# Patient Record
Sex: Male | Born: 1960 | Race: Asian | Hispanic: No | Marital: Single | Smoking: Former smoker
Health system: Southern US, Community
[De-identification: ages and names within clinical notes are randomized; demographics above are authoritative.]

---

## 2013-09-20 ENCOUNTER — Observation Stay (HOSPITAL_COMMUNITY)
Admission: EM | Admit: 2013-09-20 | Discharge: 2013-09-21 | Disposition: A | Discharge status: Home / Self Care | Payer: Self-pay | Attending: Internal Medicine | Admitting: Internal Medicine

## 2013-09-20 ENCOUNTER — Emergency Department (HOSPITAL_COMMUNITY): Payer: Self-pay

## 2013-09-20 ENCOUNTER — Encounter (HOSPITAL_COMMUNITY): Payer: Self-pay | Admitting: Emergency Medicine

## 2013-09-20 DIAGNOSIS — E872 Acidosis, unspecified: Secondary | ICD-10-CM | POA: Diagnosis present

## 2013-09-20 DIAGNOSIS — T7840XA Allergy, unspecified, initial encounter: Secondary | ICD-10-CM

## 2013-09-20 DIAGNOSIS — R220 Localized swelling, mass and lump, head: Secondary | ICD-10-CM | POA: Diagnosis present

## 2013-09-20 DIAGNOSIS — Z23 Encounter for immunization: Secondary | ICD-10-CM | POA: Insufficient documentation

## 2013-09-20 DIAGNOSIS — R61 Generalized hyperhidrosis: Secondary | ICD-10-CM | POA: Diagnosis present

## 2013-09-20 DIAGNOSIS — K117 Disturbances of salivary secretion: Secondary | ICD-10-CM | POA: Diagnosis present

## 2013-09-20 DIAGNOSIS — R4182 Altered mental status, unspecified: Secondary | ICD-10-CM | POA: Diagnosis present

## 2013-09-20 DIAGNOSIS — I959 Hypotension, unspecified: Principal | ICD-10-CM | POA: Diagnosis present

## 2013-09-20 DIAGNOSIS — R350 Frequency of micturition: Secondary | ICD-10-CM | POA: Diagnosis present

## 2013-09-20 LAB — CG4 I-STAT (LACTIC ACID)
Lactic Acid, Venous: 6.73 mmol/L — ABNORMAL HIGH (ref 0.5–2.2)
Lactic Acid, Venous: 3.01 mmol/L — ABNORMAL HIGH (ref 0.5–2.2)

## 2013-09-20 LAB — URINALYSIS, ROUTINE W REFLEX MICROSCOPIC
Bilirubin Urine: NEGATIVE
Glucose, UA: NEGATIVE mg/dL
Leukocytes, UA: NEGATIVE
Protein, ur: NEGATIVE mg/dL
Specific Gravity, Urine: 1.01 (ref 1.005–1.030)
Urobilinogen, UA: 0.2 mg/dL (ref 0.0–1.0)

## 2013-09-20 LAB — CBC WITH DIFFERENTIAL/PLATELET
Basophils Absolute: 0 10*3/uL (ref 0.0–0.1)
Basophils Relative: 0 % (ref 0–1)
Eosinophils Absolute: 0.1 10*3/uL (ref 0.0–0.7)
Eosinophils Relative: 2 % (ref 0–5)
HCT: 47.2 % (ref 39.0–52.0)
Hemoglobin: 15.8 g/dL (ref 13.0–17.0)
Lymphocytes Relative: 50 % — ABNORMAL HIGH (ref 12–46)
Lymphs Abs: 3.9 10*3/uL (ref 0.7–4.0)
MCH: 26.5 pg (ref 26.0–34.0)
MCHC: 33.5 g/dL (ref 30.0–36.0)
MCV: 79.1 fL (ref 78.0–100.0)
Monocytes Absolute: 0.7 10*3/uL (ref 0.1–1.0)
Monocytes Relative: 8 % (ref 3–12)
Neutro Abs: 3.2 10*3/uL (ref 1.7–7.7)
Neutrophils Relative %: 40 % — ABNORMAL LOW (ref 43–77)
Platelets: 198 10*3/uL (ref 150–400)
RBC: 5.97 MIL/uL — ABNORMAL HIGH (ref 4.22–5.81)
RDW: 13.1 % (ref 11.5–15.5)
WBC: 7.9 10*3/uL (ref 4.0–10.5)

## 2013-09-20 LAB — BASIC METABOLIC PANEL
CO2: 18 mEq/L — ABNORMAL LOW (ref 19–32)
Calcium: 8.1 mg/dL — ABNORMAL LOW (ref 8.4–10.5)
Creatinine, Ser: 1.06 mg/dL (ref 0.50–1.35)
GFR calc non Af Amer: 80 mL/min — ABNORMAL LOW (ref >90)
Glucose, Bld: 131 mg/dL — ABNORMAL HIGH (ref 70–99)
Potassium: 2.9 mEq/L — ABNORMAL LOW (ref 3.5–5.1)
Sodium: 139 mEq/L (ref 135–145)

## 2013-09-20 LAB — URINE MICROSCOPIC-ADD ON

## 2013-09-20 MED ORDER — SODIUM CHLORIDE 0.9 % IV BOLUS (SEPSIS)
1000.0000 mL | Freq: Once | INTRAVENOUS | Status: AC
  Administered 2013-09-20: 1000 mL via INTRAVENOUS

## 2013-09-20 MED ORDER — POTASSIUM CHLORIDE CRYS ER 20 MEQ PO TBCR
40.0000 meq | EXTENDED_RELEASE_TABLET | Freq: Once | ORAL | Status: AC
  Administered 2013-09-20: 40 meq via ORAL
  Filled 2013-09-20: qty 2

## 2013-09-20 NOTE — ED Notes (Signed)
Pt has been having difficulty urinating. Friend gave him a pill, that came out of plastic bag without description. EMS arrived on scene to pt unresponsive, hypotensive, diaphoretic. Arrived to trauma room C Alert, eyes PERRL, shivering, skin moist, obeys commands, speaking broken english. Pt is from [place].

## 2013-09-20 NOTE — ED Notes (Signed)
Translator used. Pt states he has had increased urinary frequency since 1800 hrs. Relative gave him a medication, he became cold and numb around lips. Takes medications for cold and cough. Denies medical hx, and says he has never been tested. Some days only has 1 meal a day. Today more tired than normal.

## 2013-09-20 NOTE — ED Notes (Signed)
[phone] Alecia Lemming is his transport home

## 2013-09-20 NOTE — ED Notes (Signed)
500 ml NS administered by EMS prior to arrival. 2nd bag of 500 ml NS hanging on arrival.

## 2013-09-20 NOTE — ED Provider Notes (Signed)
I saw and evaluated the patient, reviewed the resident's note and I agree with the findings and plan.  EKG Interpretation     Ventricular Rate:  95 PR Interval:  169 QRS Duration: 86 QT Interval:  355 QTC Calculation: 446 R Axis:   75 Text Interpretation:  Sinus rhythm Normal ECG No previous tracing            Pt brought by EMS after syncope.  Limited English, used interpretor, pt is a Engineer, site, originally from [place].  Pt had been complaining of urinary freq, took pills from a friend, appears to be Cipro.  Pt had syncope, unresponsive, EMS reports unresponsive with very low BP, had received 600 ml of IVF's en route, BP was low, became more responsive.  Pt appears dehydrated with dry MM.  abd soft, cardiac is RRR, intact pulses now, seems oriented at this point, no focal deficits.  Lactate was initially elevated, Co2 low, but doubt DKA with glucose only 131, but anion gap of 18; no fever.  After IVF's, lactic acid is improving, K+ is low.  Due to lab abn's, syncope with unresponsiveness, and no ongoing medical care, would recommend brief observation admission under cardiac telemetry.    Gavin Pound. Oletta Lamas, MD 09/24/13 2136

## 2013-09-21 ENCOUNTER — Encounter (HOSPITAL_COMMUNITY): Payer: Self-pay | Admitting: *Deleted

## 2013-09-21 ENCOUNTER — Emergency Department (HOSPITAL_COMMUNITY): Payer: Self-pay

## 2013-09-21 DIAGNOSIS — T7840XA Allergy, unspecified, initial encounter: Secondary | ICD-10-CM

## 2013-09-21 DIAGNOSIS — I959 Hypotension, unspecified: Secondary | ICD-10-CM | POA: Diagnosis present

## 2013-09-21 DIAGNOSIS — R4182 Altered mental status, unspecified: Secondary | ICD-10-CM | POA: Diagnosis present

## 2013-09-21 DIAGNOSIS — R220 Localized swelling, mass and lump, head: Secondary | ICD-10-CM | POA: Diagnosis present

## 2013-09-21 DIAGNOSIS — R350 Frequency of micturition: Secondary | ICD-10-CM | POA: Diagnosis present

## 2013-09-21 DIAGNOSIS — E872 Acidosis, unspecified: Secondary | ICD-10-CM | POA: Diagnosis present

## 2013-09-21 DIAGNOSIS — K117 Disturbances of salivary secretion: Secondary | ICD-10-CM | POA: Diagnosis present

## 2013-09-21 DIAGNOSIS — R072 Precordial pain: Secondary | ICD-10-CM

## 2013-09-21 DIAGNOSIS — R61 Generalized hyperhidrosis: Secondary | ICD-10-CM | POA: Diagnosis present

## 2013-09-21 LAB — TROPONIN I
Troponin I: <0.3 ng/mL (ref <0.30)
Troponin I: <0.3 ng/mL (ref <0.30)

## 2013-09-21 LAB — POCT I-STAT TROPONIN I: Troponin i, poc: 0 ng/mL (ref 0.00–0.08)

## 2013-09-21 LAB — BASIC METABOLIC PANEL
BUN: 8 mg/dL (ref 6–23)
CO2: 23 mEq/L (ref 19–32)
Calcium: 8 mg/dL — ABNORMAL LOW (ref 8.4–10.5)
Creatinine, Ser: 0.88 mg/dL (ref 0.50–1.35)
Glucose, Bld: 110 mg/dL — ABNORMAL HIGH (ref 70–99)
Sodium: 143 mEq/L (ref 135–145)

## 2013-09-21 LAB — CBC
MCH: 26.3 pg (ref 26.0–34.0)
MCHC: 33 g/dL (ref 30.0–36.0)
MCV: 79.7 fL (ref 78.0–100.0)
Platelets: 190 10*3/uL (ref 150–400)
RBC: 5.75 MIL/uL (ref 4.22–5.81)
RDW: 13.2 % (ref 11.5–15.5)
WBC: 12.4 10*3/uL — ABNORMAL HIGH (ref 4.0–10.5)

## 2013-09-21 MED ORDER — POTASSIUM CHLORIDE IN NACL 20-0.9 MEQ/L-% IV SOLN
INTRAVENOUS | Status: AC
  Administered 2013-09-21: 03:00:00 via INTRAVENOUS
  Filled 2013-09-21: qty 1000

## 2013-09-21 MED ORDER — INFLUENZA VAC SPLIT QUAD 0.5 ML IM SUSP
0.5000 mL | Freq: Once | INTRAMUSCULAR | Status: AC
  Administered 2013-09-21: 16:00:00 0.5 mL via INTRAMUSCULAR
  Filled 2013-09-21: qty 0.5

## 2013-09-21 MED ORDER — SODIUM CHLORIDE 0.9 % IJ SOLN
3.0000 mL | Freq: Two times a day (BID) | INTRAMUSCULAR | Status: DC
  Administered 2013-09-21: 03:00:00 3 mL via INTRAVENOUS

## 2013-09-21 NOTE — Discharge Summary (Signed)
Physician Discharge Summary  Shawn Robertson RUE:[id number] DOB: January 17, 1961 DOA: 09/20/2013  PCP: No PCP Per Patient  Admit date: 09/20/2013 Discharge date: 09/21/2013  Time spent: 35 minutes  Recommendations for Outpatient Follow-up:  1. 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 Pt also has 2 friends here who speak good english who were with him when this event happened earlier today. Pt has a very strict schedule, fasts chronically. Over the past couple of days he has noticed some increase in urinary freq, no fevers, no chills, no hematuria, no dysuria. He has a friend who also sometimes has the same problem so his friend gave him a "pill" to take to help with this problem. Approximately 30 minutes after he took this "pill" his friend noticed he was acting differently, was not responding to them verbally his face started swelling and he started drooling from his mouth. ems was called, on arrival he was only responsive to painful stimuli (unclear if he actually passed out) and his systolic blood pressure was noted to be in the 70's. No rash was noted by the friend, or ems at that time. He was given an ivf bolus of 600cc, on arrival to ED his bp had normalized and he was responding/awake. Now as i'm seeing him in the ED, his symptoms have resolved. He denies any pain. No facial swelling now or drooling. No numb/tingling anywhere.  No problem moving any extremities. He has had no fevers. No n/v/d. No rashes. No headache. No cp. No sob. No cough. No le edema or swelling.   Hospital Course:  Shawn Robertson is a pleasant 52 year old gentleman with no significant past medical history who was brought to the emergency room on 09/20/2013 after developing facial swelling, drooling, diaphoresis, and generalized weakness which developed apparently 30 minutes after taking tablet of ciprofloxacin given to him by friends. In the emergency room he was found to be hypotensive with a blood pressure of 70/30, responding to fluid challenge with normal saline. This all started when patient told his friends about his increased urinary frequency (without hematuria or dysuria) for which they gave him a tablet of ciprofloxacin. Urinalysis checked of his hospitalization was unremarkable, as urine was negative for nitrates leukocyte esterase, with only rare bacteria. Appearance of urine was clear.  He was placed in overnight observation, by the following day he reported feeling much better. There was improvement in facial swelling, as he was able tolerate by mouth intake. He denied shortness of breath, chest pain, fevers or chills. I spoke with his friend Shawn Robertson over telephone conversation, and strongly advised that patient should seek medical attention prior to be given left over prescription medications at home. I explained to him that his symptoms likely resulted from an allergic reaction to the ciprofloxacin tablet. His friend Shawn Robertson verbalized understanding.   Discharge Exam: Filed Vitals:   09/21/13 0514  BP: 117/79  Pulse: 93  Temp:   Resp:  General: Patient reports feeling better states "feeling good" Cardiovascular: Regular rate and rhythm normal S1-S2 Respiratory: Lungs are clear to auscultation bilaterally, normal respiratory effort. I did not note stridor Abdomen: Soft nontender nondistended positive bowel sound Extremities no  edema  Discharge Instructions  Discharge Orders   Future Orders Complete By Expires   Call MD for:  difficulty breathing, headache or visual disturbances  As directed    Call MD for:  hives  As directed    Call MD for:  persistant nausea and vomiting  As directed    Call MD for:  temperature >100.4  As directed    Diet - low sodium heart healthy  As directed    Discharge instructions  As directed    Comments:     Please follow up with your doctor in 1-2 weeks   Increase activity slowly  As directed        Medication List    STOP taking these medications       UNABLE TO FIND       No Known Allergies     Follow-up Information   Follow up with No PCP Per Patient In 1 week.   Specialty:  General Practice   Contact information:   [address] [place] [address] [phone]        The results of significant diagnostics from this hospitalization (including imaging, microbiology, ancillary and laboratory) are listed below for reference.    Significant Diagnostic Studies: Dg Chest 2 View  09/20/2013   CLINICAL DATA:  Altered mental status  EXAM: CHEST  2 VIEW  COMPARISON:  None.  FINDINGS: The heart size and mediastinal contours are within normal limits. Both lungs are clear. The visualized skeletal structures are unremarkable. Relatively low lung volumes .  IMPRESSION: No active cardiopulmonary disease.   Electronically Signed   By: Oley Balm M.D.   On: 09/20/2013 23:26   Ct Head Wo Contrast  09/21/2013   CLINICAL DATA:  Hypotensive, altered mental status.  EXAM: CT HEAD WITHOUT CONTRAST  TECHNIQUE: Contiguous axial images were obtained from the base of the skull through the vertex without intravenous contrast.  COMPARISON:  None.  FINDINGS: There is no evidence of acute intracranial hemorrhage, brain edema, mass lesion, acute infarction, mass effect, or midline shift. Acute infarct may be in apparent on noncontrast CT. No other intra-axial abnormalities are  seen, and the ventricles and sulci are within normal limits in size and symmetry. No abnormal extra-axial fluid collections or masses are identified. No significant calvarial abnormality. :  IMPRESSION: Negative for bleed or other acute intracranial process.   Electronically Signed   By: Oley Balm M.D.   On: 09/21/2013 00:53    Microbiology: No results found for this or any previous visit (from the past 240 hour(s)).   Labs: Basic Metabolic Panel:  Recent Labs Lab 09/20/13 2115 09/21/13 0245  NA 139 143  K 2.9* 4.1  CL 103 109  CO2 18* 23  GLUCOSE 131* 110*  BUN 11 8  CREATININE 1.06 0.88  CALCIUM 8.1* 8.0*   Liver Function Tests: No results found for this basename: AST, ALT, ALKPHOS, BILITOT, PROT, ALBUMIN,  in the last 168 hours No results found for this basename: LIPASE, AMYLASE,  in the last 168 hours No results found for this basename: AMMONIA,  in the last 168 hours CBC:  Recent Labs Lab 09/20/13 2115 09/21/13 0245  WBC 7.9 12.4*  NEUTROABS 3.2  --   HGB 15.8  15.1  HCT 47.2 45.8  MCV 79.1 79.7  PLT 198 190   Cardiac Enzymes:  Recent Labs Lab 09/21/13 0245 09/21/13 0804  TROPONINI <0.30 <0.30   BNP: BNP (last 3 results) No results found for this basename: PROBNP,  in the last 8760 hours CBG: No results found for this basename: GLUCAP,  in the last 168 hours     Signed:  Jeralyn Bennett  [facility] 09/21/2013, 10:35 AM

## 2013-09-21 NOTE — Progress Notes (Signed)
  Echocardiogram 2D Echocardiogram has been performed.  Reis Pienta FRANCES 09/21/2013, 10:06 AM

## 2013-09-21 NOTE — Progress Notes (Signed)
Patient discharge teaching given through interpreter line, including activity, diet, follow-up appoints, and medications. Patient verbalized understanding of all discharge instructions. IV access was d/c'd. Vitals are stable. Skin is intact except as charted in most recent assessments. Pt to be escorted out by NT, to be driven home by family.

## 2013-09-21 NOTE — Progress Notes (Signed)
Pt admitted to floor. Patient is from [place] and does not speak [place]. [employer] were contacted and unable to find an appropriate interpreter. Spoke with interpreter [phone]. Pt gave phone number of a friend who speaks English/[place]: Alecia Lemming [phone].

## 2013-09-21 NOTE — Progress Notes (Signed)
UR complete. Nira Visscher RN CCM Case Mgmt  

## 2013-09-21 NOTE — H&P (Signed)
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  Pt also has 2 friends here who speak good english who were with him when this event happened earlier today.  Pt has a very strict schedule, fasts chronically.  Over the past couple of days he has noticed some increase in urinary freq, no fevers, no chills, no hematuria, no dysuria.  He has a friend who also sometimes has the same problem so his friend gave him a "pill" to take to help with this problem.  Approximately 30 minutes after he took this "pill" his friend noticed he was acting differently, was not responding to them verbally his face started swelling and he started drooling from his mouth.  ems was called, on arrival he was only responsive to painful stimuli (unclear if he actually passed out) and his systolic blood pressure was noted to be in the 70's.  No rash was noted by the friend, or ems at that time.  He was given an ivf bolus of 600cc, on arrival to ED his bp had normalized and he was responding/awake.  Now as i'm seeing him in the ED, his symptoms have resolved.  He denies any pain.  No facial swelling now or drooling.  No numb/tingling anywhere.  No problem moving any extremities.  He has had no fevers.  No n/v/d.  No rashes.  No headache.  No cp.  No sob.  No cough.  No le edema or swelling.  Pharmacy has identified the "pill" as ciprofloxacin.  Review of Systems:  Positive and negative as per HPI otherwise all other systems are negative  Past Medical History: History reviewed. No pertinent past medical history. No past surgical history on file.  Medications: Prior to Admission medications   Medication Sig Start Date End Date Taking? Authorizing Provider  UNABLE TO FIND Take 1 tablet by mouth once. Med Name:unknown pill from family member   Yes Historical Provider, MD  Takes no daily  meds  Allergies:  No Known Allergies  Social History:  reports that he has quit smoking. He has never used smokeless tobacco. He reports that he does not drink alcohol. His drug history is not on file.  Family History: none  Physical Exam: Filed Vitals:   09/20/13 2145 09/20/13 2200 09/20/13 2215 09/20/13 2246  BP: 104/81 114/78 107/87   Pulse: 93 96 98   Temp:    97.5 F (36.4 C)  TempSrc:    Oral  Resp: 24 21 20    Weight:      SpO2: 100% 100% 99%    General appearance: alert, cooperative and no distress Head: Normocephalic, without obvious abnormality, atraumatic Eyes: negative Nose: Nares normal. Septum midline. Mucosa normal. No drainage or sinus tenderness. Neck: no JVD and supple, symmetrical, trachea midline Lungs: clear to auscultation bilaterally Heart: regular rate and rhythm, S1, S2 normal, no murmur, click, rub or gallop Abdomen: soft, non-tender; bowel sounds normal; no masses,  no organomegaly Extremities: extremities normal, atraumatic, no cyanosis or edema Pulses: 2+ and symmetric Skin: Skin color, texture, turgor normal. No rashes or lesions Neurologic: Grossly normal  Labs on Admission:   Recent Labs  09/20/13 2115  NA 139  K 2.9*  CL 103  CO2 18*  GLUCOSE 131*  BUN 11  CREATININE 1.06  CALCIUM 8.1*    Recent Labs  09/20/13 2115  WBC 7.9  NEUTROABS 3.2  HGB  15.8  HCT 47.2  MCV 79.1  PLT 198    Radiological Exams on Admission: Dg Chest 2 View  09/20/2013   CLINICAL DATA:  Altered mental status  EXAM: CHEST  2 VIEW  COMPARISON:  None.  FINDINGS: The heart size and mediastinal contours are within normal limits. Both lungs are clear. The visualized skeletal structures are unremarkable. Relatively low lung volumes .  IMPRESSION: No active cardiopulmonary disease.   Electronically Signed   By: Oley Balm M.D.   On: 09/20/2013 23:26    Assessment/Plan  52 yo male with episode of hypotension, facial swelling, drooling after taking  ciprofloxacin who has also been having urinary frequency with normal ua  Principal Problem:   Hypotension - sounds like he had allergic reaction to ciprofloxacin.  Symptoms have resolved at this time.  Ct head is pending.  Will get freq neuro cks overnight.  If any deficits, would proceed with mri in am.  bp is stable now, lactic acidosis likely due to hypoperfusion earlier, no source of infection with normal ua and normal cxr.  Wbc is also normal.  Will hold off on abx at this time, cont ivf.  Repeat lactic acid level in am.  Monitor closely.  If any spike in temp tonight, would have low threshold to start abx empirically.  ekg nsr no acute changes.  Active Problems:   Altered mental status   Facial swelling   Drooling   Increased urinary frequency   Diaphoresis   Lactic acidosis    Aryani Daffern A 09/21/2013, 12:31 AM

## 2013-09-21 NOTE — Care Management Note (Signed)
    Page 1 of 1   09/21/2013     2:16:50 PM   CARE MANAGEMENT NOTE 09/21/2013  Patient:  Shawn Robertson, Shawn Robertson   Account Number:  [id number]  Date Initiated:  09/21/2013  Documentation initiated by:  Letha Cape  Subjective/Objective Assessment:   dx hypotension  admit as observation- lives with friends.  pta indep.     Action/Plan:   Anticipated DC Date:  09/21/2013   Anticipated DC Plan:  HOME/SELF CARE      DC Planning Services  CM consult  [facility]      Choice offered to / List presented to:             Status of service:  Completed, signed off Medicare Important Message given?   (If response is "NO", the following Medicare IM given date fields will be blank) Date Medicare IM given:   Date Additional Medicare IM given:    Discharge Disposition:  HOME/SELF CARE  Per UR Regulation:  Reviewed for med. necessity/level of care/duration of stay  If discussed at Long Length of Stay Meetings, dates discussed:    Comments:

## 2013-09-21 NOTE — ED Provider Notes (Signed)
CSN: [id number]     Arrival date & time 09/20/13  2106 History   First MD Initiated Contact with Patient 09/20/13 2108     Chief Complaint  Patient presents with  . Altered Mental Status    The history is provided by the patient. The history is limited by a language barrier. A language interpreter was used.   Mr. Shawn Robertson is a 52 y.o. [place] male who comes emergency department today with altered mental status, diaphoresis, and hypotension. He had been having frequent urination for the past few hours with chills. He was concerned he may have urinary tract infection took her medication offered to him by a friend. Approximately 2 hours after taking the medication he developed diaphoresis, shortness of breath, and altered mental status. The family contacted EMS. When EMS arrived on scene he was unresponsive to everything except for pain. He was hypotensive with a blood pressure of 70/30. EMS treated him with 600 mL of normal saline and transferred him to the emergency department. He has had no fevers. No abdominal pain. He does report dysuria and back pain. He rates the pain in his back a 4/10. He denies testicular pain. He has been eating and drinking normally.   History reviewed. No pertinent past medical history. No past surgical history on file. No family history on file. History  Substance Use Topics  . Smoking status: Former Games developer  . Smokeless tobacco: Never Used  . Alcohol Use: No    Review of Systems  Constitutional: Positive for chills. Negative for fever.  HENT: Positive for drooling.   Respiratory: Negative for cough and shortness of breath.   Cardiovascular: Negative for chest pain.  Gastrointestinal: Negative for nausea, vomiting, diarrhea and constipation.  Genitourinary: Positive for dysuria and frequency. Negative for urgency, penile swelling, scrotal swelling and testicular pain.  Musculoskeletal: Negative for back pain and neck pain.  All other systems reviewed and  are negative.    Allergies  Review of patient's allergies indicates no known allergies.  Home Medications   Current Outpatient Rx  Name  Route  Sig  Dispense  Refill  . UNABLE TO FIND   Oral   Take 1 tablet by mouth once. Med Name: unknown pill from family member          BP 113/72  Pulse 89  Temp(Src) 97.5 F (36.4 C) (Oral)  Resp 15  Wt 154 lb 12.2 oz (70.2 kg)  SpO2 97% Physical Exam  Nursing note and vitals reviewed. Constitutional: He is oriented to person, place, and time. He appears well-developed and well-nourished. No distress.  HENT:  Head: Normocephalic and atraumatic.  Eyes: Pupils are equal, round, and reactive to light.  Neck: Normal range of motion.  Cardiovascular: Normal rate, regular rhythm and normal heart sounds.   Pulmonary/Chest: Effort normal and breath sounds normal. No respiratory distress. He has no decreased breath sounds. He has no wheezes. He has no rhonchi. He has no rales.  Abdominal: Soft. He exhibits no distension. There is no tenderness. There is no rebound and no guarding.  Genitourinary: Right testis shows no mass and no tenderness. Cremasteric reflex is not absent on the right side. Left testis shows no mass and no tenderness. Cremasteric reflex is not absent on the left side.  Musculoskeletal: He exhibits no edema and no tenderness.  Neurological: He is alert and oriented to person, place, and time. He exhibits normal muscle tone.  Skin: Skin is warm and dry.    ED Course  Procedures (including critical care time) Labs Review Labs Reviewed  CBC WITH DIFFERENTIAL - Abnormal; Notable for the following:    RBC 5.97 (*)    Neutrophils Relative % 40 (*)    Lymphocytes Relative 50 (*)    All other components within normal limits  BASIC METABOLIC PANEL - Abnormal; Notable for the following:    Potassium 2.9 (*)    CO2 18 (*)    Glucose, Bld 131 (*)    Calcium 8.1 (*)    GFR calc non Af Amer 80 (*)    All other components within  normal limits  URINALYSIS, ROUTINE W REFLEX MICROSCOPIC - Abnormal; Notable for the following:    Hgb urine dipstick SMALL (*)    All other components within normal limits  CG4 I-STAT (LACTIC ACID) - Abnormal; Notable for the following:    Lactic Acid, Venous 6.73 (*)    All other components within normal limits  CG4 I-STAT (LACTIC ACID) - Abnormal; Notable for the following:    Lactic Acid, Venous 3.01 (*)    All other components within normal limits  URINE CULTURE  CULTURE, BLOOD (ROUTINE X 2)  CULTURE, BLOOD (ROUTINE X 2)  URINE MICROSCOPIC-ADD ON  POCT I-STAT TROPONIN I   Imaging Review Dg Chest 2 View  09/20/2013   CLINICAL DATA:  Altered mental status  EXAM: CHEST  2 VIEW  COMPARISON:  None.  FINDINGS: The heart size and mediastinal contours are within normal limits. Both lungs are clear. The visualized skeletal structures are unremarkable. Relatively low lung volumes .  IMPRESSION: No active cardiopulmonary disease.   Electronically Signed   By: Oley Balm M.D.   On: 09/20/2013 23:26   Ct Head Wo Contrast  09/21/2013   CLINICAL DATA:  Hypotensive, altered mental status.  EXAM: CT HEAD WITHOUT CONTRAST  TECHNIQUE: Contiguous axial images were obtained from the base of the skull through the vertex without intravenous contrast.  COMPARISON:  None.  FINDINGS: There is no evidence of acute intracranial hemorrhage, brain edema, mass lesion, acute infarction, mass effect, or midline shift. Acute infarct may be in apparent on noncontrast CT. No other intra-axial abnormalities are seen, and the ventricles and sulci are within normal limits in size and symmetry. No abnormal extra-axial fluid collections or masses are identified. No significant calvarial abnormality. :  IMPRESSION: Negative for bleed or other acute intracranial process.   Electronically Signed   By: Oley Balm M.D.   On: 09/21/2013 00:53    EKG Interpretation     Ventricular Rate:  95 PR Interval:  169 QRS  Duration: 86 QT Interval:  355 QTC Calculation: 446 R Axis:   75 Text Interpretation:  Sinus rhythm Normal ECG No previous tracing            MDM   Patient is a 52 year old male comes to the emergency department today with altered mental status and frequency of urination. Physical exam as above. Initial workup included a stat lactic acid, UA, blood cultures x2, urine culture, CBC, and BMP. He was treated with a liter of normal saline. In the emergency department he was no longer altered.  He was responsive. He was not hypotensive or tachycardic. BMP had a potassium of 2.9 this is replaced with 40 mEq of potassium by mouth. BMP otherwise unremarkable. CBC with a WBC of 7.9. He did not meet septic criteria. Lactic acid was elevated to 6.7. UA was negative. As results of your urinary tract infection. A chest x-ray and  EKG were obtained at that time. EKG as above. Chest x-ray was unremarkable. Doubt pneumonia or STEMI. Patient was felt to require admission for his history of altered mental status and hypotension prior to evaluation the emergency department. The hospitalist consult. They requested a CT of his head and a troponin be obtained prior to admission. These were obtained and were negative. Patient was admitted to the hospitalist service in stable condition. Labs and imaging review by myself and considered and medical decision-making. Imaging was interpreted by radiology. Care was discussed with my attending Dr. Oletta Lamas.   1. Altered mental status   2. Diaphoresis   3. Drooling   4. Facial swelling   5. Hypotension   6. Increased urinary frequency        Bethann Berkshire, MD 09/21/13 [phone]

## 2013-09-21 NOTE — Progress Notes (Signed)
NURSING PROGRESS NOTE  Shawn Robertson [id number] Admission Data: 09/21/2013 0230 Attending Provider: Haydee Monica, MD PCP:No PCP Per Patient Code Status: full  Shawn Robertson is a 52 y.o. male patient admitted from ED:  -No acute distress noted.  -No complaints of shortness of breath.  -No complaints of chest pain.   Cardiac Monitoring: Box # 21 in place. Cardiac monitor yields:normal sinus rhythm.  Blood pressure 113/72, pulse 89, temperature 97.5 F (36.4 C), temperature source Oral, resp. rate 15, weight 70.2 kg (154 lb 12.2 oz), SpO2 97.00%.   IV Fluids:  IV in place, occlusive dsg intact without redness, IV cath wrist right, condition patent and no redness and left AC, condition patent and no redness   Allergies:  Review of patient's allergies indicates no known allergies.  Past Medical History:   has no past medical history on file.  Past Surgical History:   has no past surgical history on file.  Skin: WNL, did not assess pt's buttocks or genitals out of respect for patient's cultural/spiritual beliefs. He is a Engineer, site.  Attempt was made to obtain [employer] since patient is from [place] and does not speak much [place]. RN spoke with interpreter # [phone] but they were unable to locate an appropriate interpreter at this time. Patient aware that effort was being made and gave RN a friend's phone number who he said could speak [place]. RN called friend and attempted to get answers to basic care questions. The friend asked multiple times for the RN to explain the questions, and hesitated before asking the patient questions. RN unsure of friend's ability to effectively translate to complete full admission assessment, will defer to morning when [facility] interpreter may be available. Patient orientated to room. Information packet given to patient. Admission inpatient armband information verified with patient to include name and date of birth and placed on patient  arm. Side rails up x 2, fall assessment completed. Patient verbalized understanding of how to use call bell. Call light within reach.

## 2013-09-22 LAB — URINE CULTURE: Colony Count: NO GROWTH

## 2013-09-27 LAB — CULTURE, BLOOD (ROUTINE X 2)
Culture: NO GROWTH
Culture: NO GROWTH

## 2013-10-26 ENCOUNTER — Ambulatory Visit: Payer: Self-pay

## 2013-10-26 ENCOUNTER — Inpatient Hospital Stay: Payer: Self-pay | Admitting: Internal Medicine

## 2015-06-14 IMAGING — CT CT HEAD W/O CM
2 series · 16 of 30 positions shown, 20 images · non-contrast
Comparison: None.

CLINICAL DATA: Hypotensive, altered mental status.

EXAM:
CT HEAD WITHOUT CONTRAST
TECHNIQUE: Contiguous axial images were obtained from the base of the skull
through the vertex without intravenous contrast.

[Series 2: head w/o · axial · non-contrast · 0.49mm/px · z∈[+71,+211]mm · 13 of 34 slices shown, 17 images]
[im 3/34  brain]
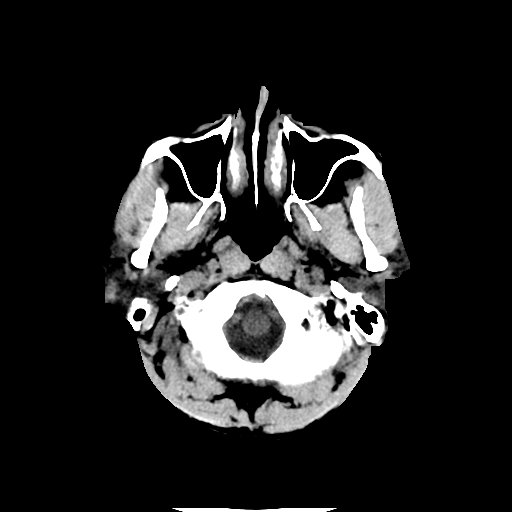
[im 3/34  bone]
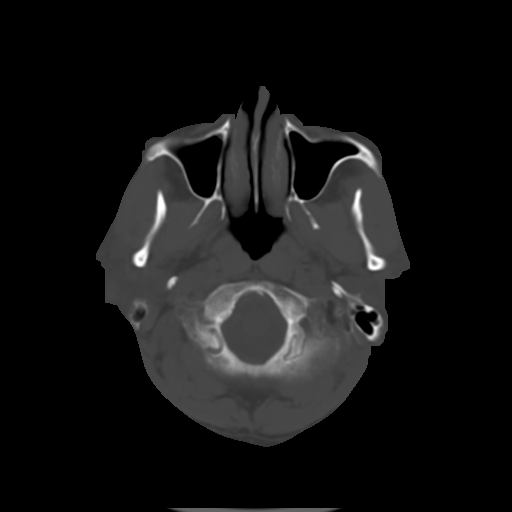
[im 5/34  brain]
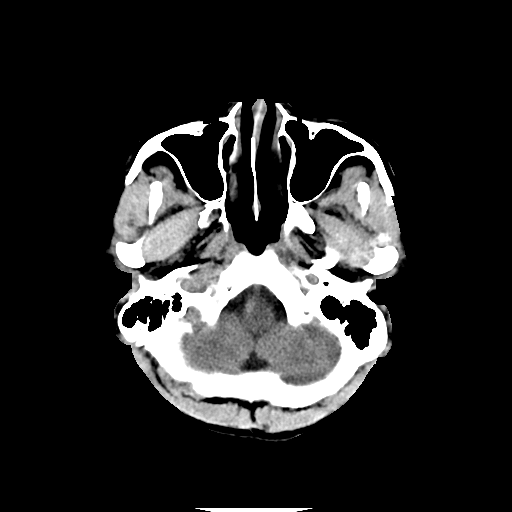
[im 8/34  brain]
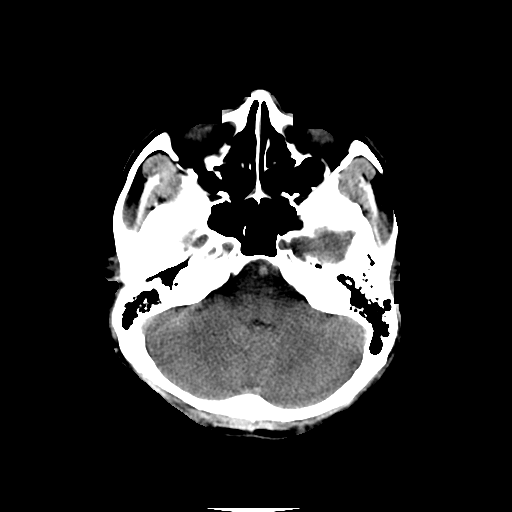
[im 10/34  brain]
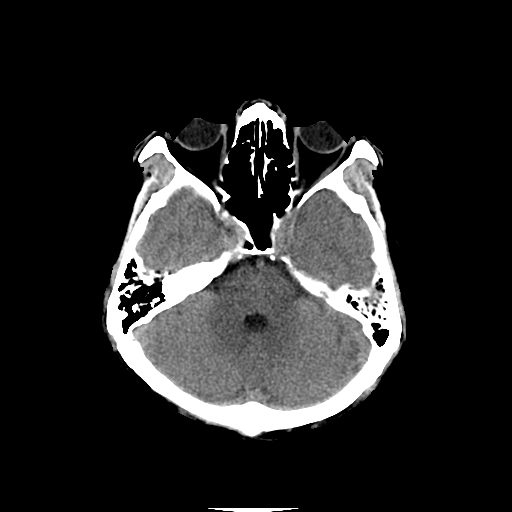
[im 12/34  brain]
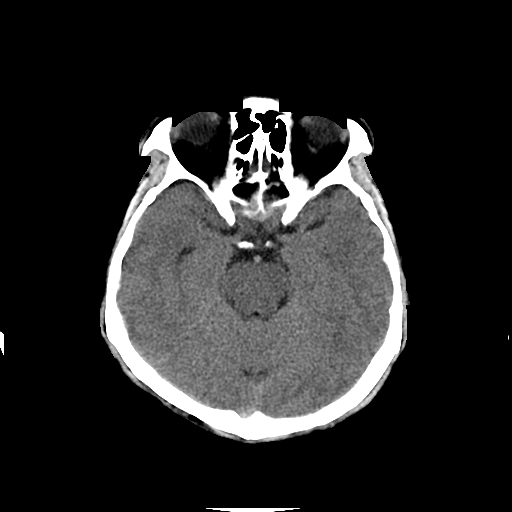
[im 12/34  bone]
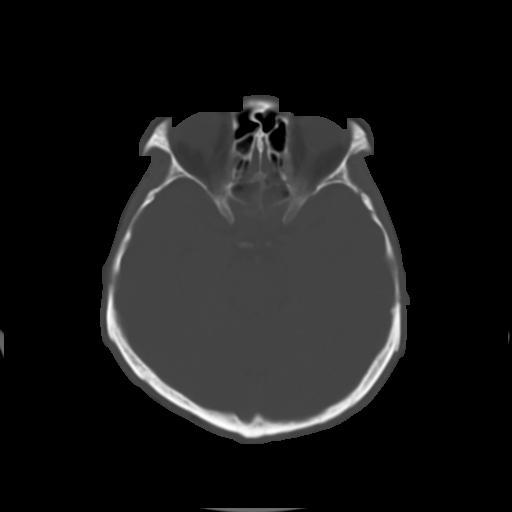
[im 15/34  brain]
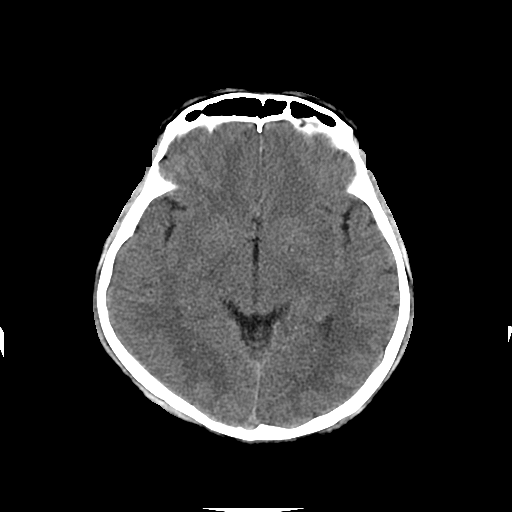
[im 17/34  brain]
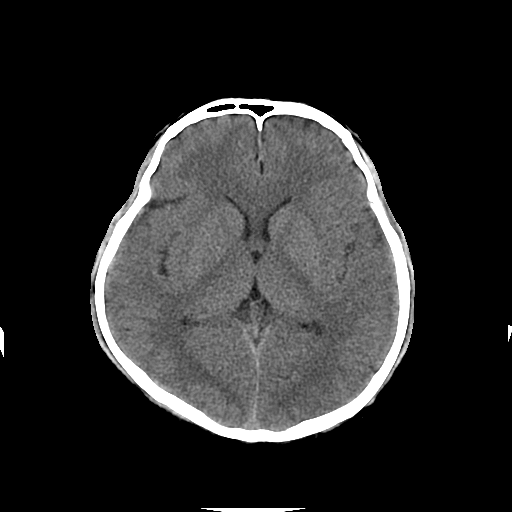
[im 19/34  brain]
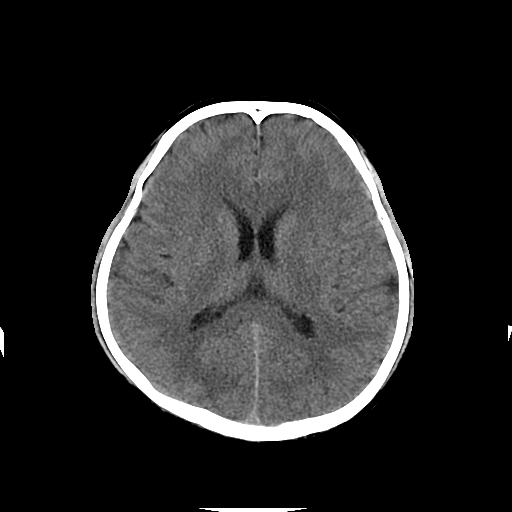
[im 22/34  brain]
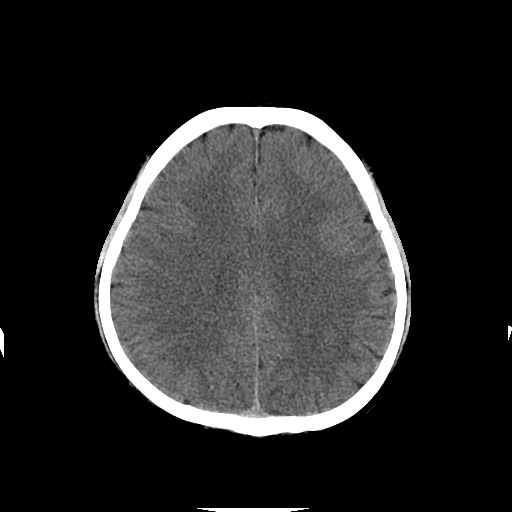
[im 22/34  bone]
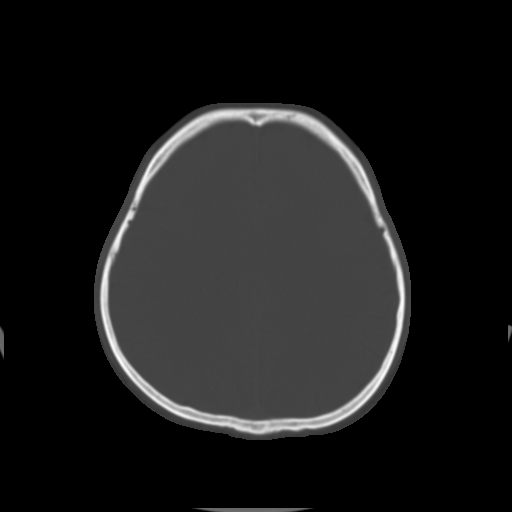
[im 24/34  brain]
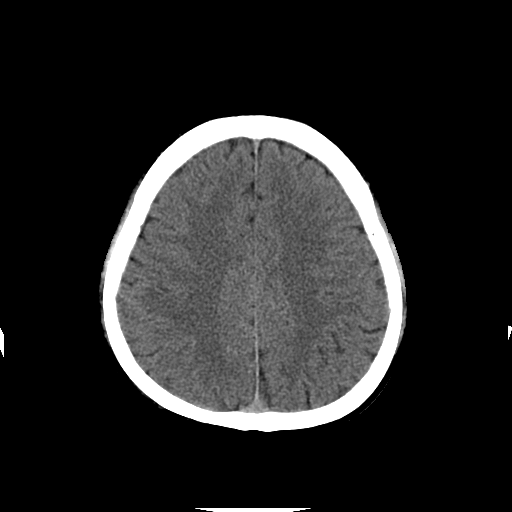
[im 26/34  brain]
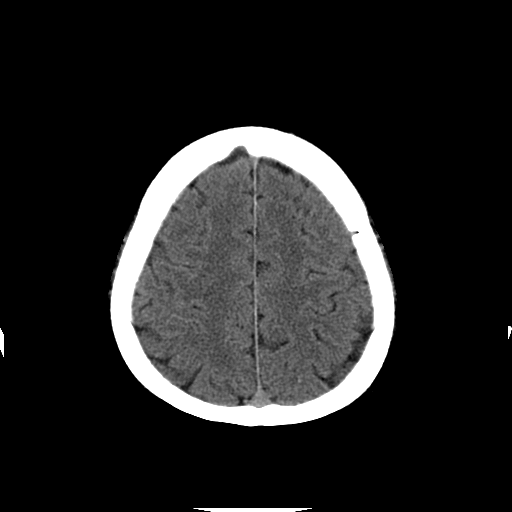
[im 29/34  brain]
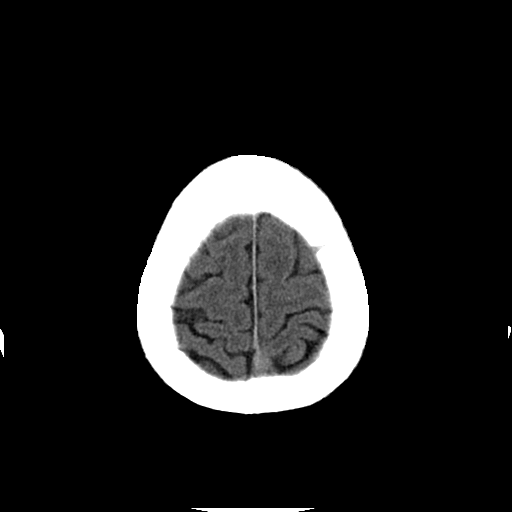
[im 31/34  brain]
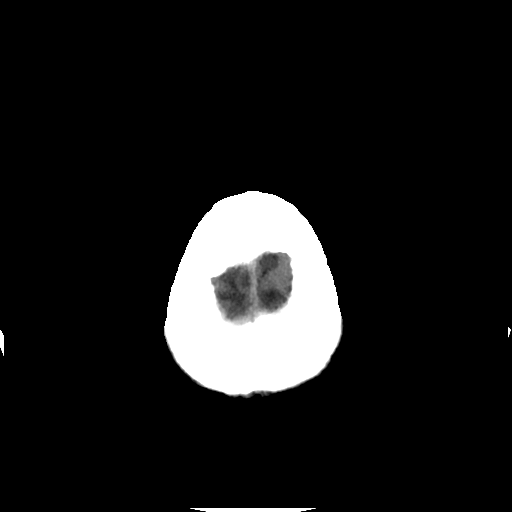
[im 31/34  bone]
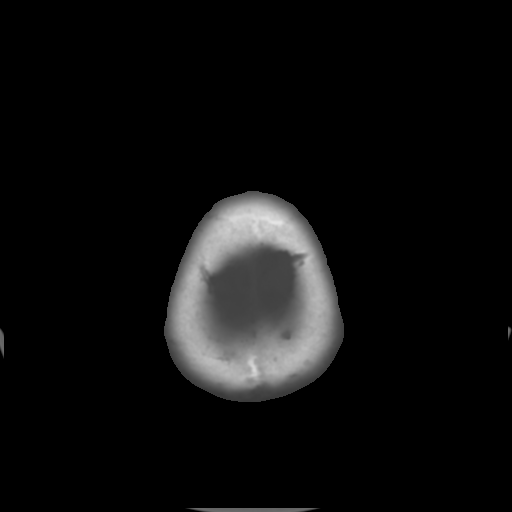

[Series 3: head w/o bone · axial · non-contrast · 0.49mm/px · z∈[+71,+116]mm · 3 of 34 slices shown]
[im 3/34  bone]
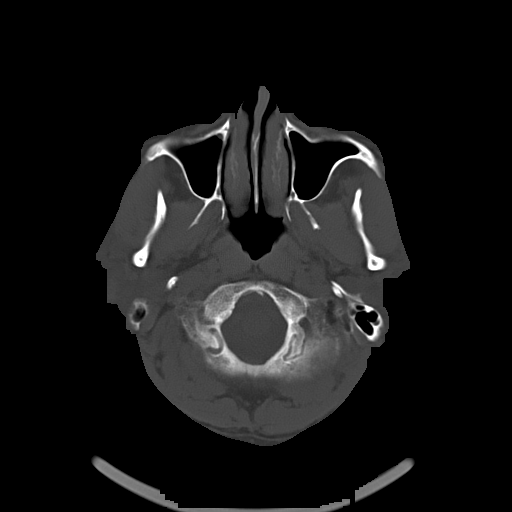
[im 8/34  bone]
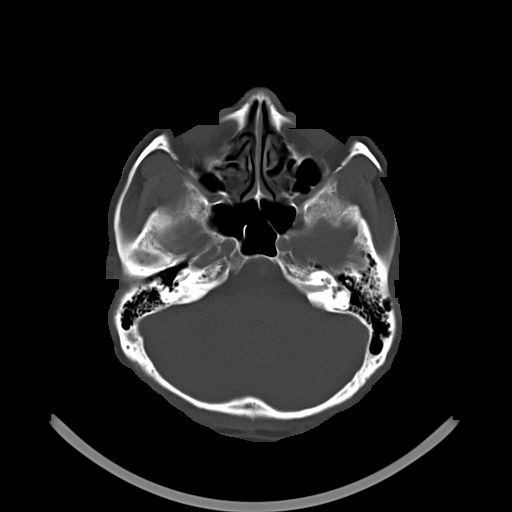
[im 12/34  bone]
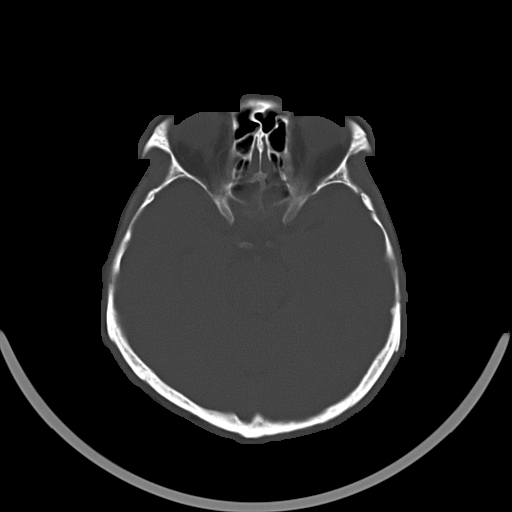

[16 of 30 positions shown; findings below may reference images not displayed]

FINDINGS: There is no evidence of acute intracranial hemorrhage, brain edema,
mass lesion, acute infarction, mass effect, or midline shift. Acute
infarct may be in apparent on noncontrast CT. No other intra-axial
abnormalities are seen, and the ventricles and sulci are within
normal limits in size and symmetry. No abnormal extra-axial fluid
collections or masses are identified. No significant calvarial
abnormality. :
IMPRESSION: Negative for bleed or other acute intracranial process.
